# Patient Record
Sex: Female | Born: 1955 | Race: White | Hispanic: No | Marital: Single
Health system: Southern US, Community
[De-identification: ages and names within clinical notes are randomized; demographics above are authoritative.]

---

## 2005-02-03 ENCOUNTER — Ambulatory Visit: Payer: Self-pay | Admitting: Family Medicine

## 2005-02-23 ENCOUNTER — Encounter: Admission: RE | Admit: 2005-02-23 | Discharge: 2005-02-23 | Payer: Self-pay | Admitting: Sports Medicine

## 2005-03-01 ENCOUNTER — Encounter (INDEPENDENT_AMBULATORY_CARE_PROVIDER_SITE_OTHER): Payer: Self-pay | Admitting: *Deleted

## 2005-03-01 LAB — CONVERTED CEMR LAB

## 2005-03-02 ENCOUNTER — Encounter (INDEPENDENT_AMBULATORY_CARE_PROVIDER_SITE_OTHER): Payer: Self-pay | Admitting: *Deleted

## 2005-03-02 ENCOUNTER — Ambulatory Visit: Payer: Self-pay | Admitting: Family Medicine

## 2006-03-27 ENCOUNTER — Ambulatory Visit: Payer: Self-pay | Admitting: Sports Medicine

## 2006-11-24 ENCOUNTER — Encounter (INDEPENDENT_AMBULATORY_CARE_PROVIDER_SITE_OTHER): Payer: Self-pay | Admitting: *Deleted

## 2007-05-16 ENCOUNTER — Other Ambulatory Visit: Admission: RE | Admit: 2007-05-16 | Discharge: 2007-05-16 | Payer: Self-pay | Admitting: *Deleted

## 2011-07-06 ENCOUNTER — Other Ambulatory Visit: Payer: Self-pay | Admitting: Family Medicine

## 2011-07-06 DIAGNOSIS — Z1231 Encounter for screening mammogram for malignant neoplasm of breast: Secondary | ICD-10-CM

## 2011-07-19 ENCOUNTER — Ambulatory Visit
Admission: RE | Admit: 2011-07-19 | Discharge: 2011-07-19 | Disposition: A | Discharge status: Home / Self Care | Payer: 59 | Source: Ambulatory Visit | Attending: Family Medicine | Admitting: Family Medicine

## 2011-07-19 DIAGNOSIS — Z1231 Encounter for screening mammogram for malignant neoplasm of breast: Secondary | ICD-10-CM

## 2012-07-27 ENCOUNTER — Other Ambulatory Visit: Payer: Self-pay | Admitting: Family Medicine

## 2012-07-27 ENCOUNTER — Other Ambulatory Visit (HOSPITAL_COMMUNITY)
Admission: RE | Admit: 2012-07-27 | Discharge: 2012-07-27 | Disposition: A | Discharge status: Home / Self Care | Payer: 59 | Source: Ambulatory Visit | Attending: Family Medicine | Admitting: Family Medicine

## 2012-07-27 DIAGNOSIS — Z780 Asymptomatic menopausal state: Secondary | ICD-10-CM

## 2012-07-27 DIAGNOSIS — Z0000 Encounter for general adult medical examination without abnormal findings: Secondary | ICD-10-CM | POA: Insufficient documentation

## 2012-07-27 DIAGNOSIS — Z1231 Encounter for screening mammogram for malignant neoplasm of breast: Secondary | ICD-10-CM

## 2012-10-24 ENCOUNTER — Other Ambulatory Visit: Payer: Self-pay | Admitting: Family Medicine

## 2012-10-24 DIAGNOSIS — Z780 Asymptomatic menopausal state: Secondary | ICD-10-CM

## 2012-11-15 ENCOUNTER — Ambulatory Visit
Admission: RE | Admit: 2012-11-15 | Discharge: 2012-11-15 | Disposition: A | Discharge status: Home / Self Care | Payer: 59 | Source: Ambulatory Visit | Attending: Family Medicine | Admitting: Family Medicine

## 2012-11-15 DIAGNOSIS — Z780 Asymptomatic menopausal state: Secondary | ICD-10-CM

## 2012-12-06 ENCOUNTER — Other Ambulatory Visit: Payer: 59

## 2014-08-01 ENCOUNTER — Other Ambulatory Visit: Payer: Self-pay

## 2015-04-07 ENCOUNTER — Other Ambulatory Visit: Payer: Self-pay | Admitting: Family Medicine

## 2015-04-07 DIAGNOSIS — Z1231 Encounter for screening mammogram for malignant neoplasm of breast: Secondary | ICD-10-CM

## 2015-07-08 ENCOUNTER — Ambulatory Visit
Admission: RE | Admit: 2015-07-08 | Discharge: 2015-07-08 | Disposition: A | Discharge status: Home / Self Care | Payer: 59 | Source: Ambulatory Visit | Attending: Family Medicine | Admitting: Family Medicine

## 2015-07-08 DIAGNOSIS — Z1231 Encounter for screening mammogram for malignant neoplasm of breast: Secondary | ICD-10-CM

## 2015-10-16 MED FILL — SYNTHROID 88 MCG TABLET: 88 | 90 days supply | Qty: 90 | Fill #0

## 2015-11-04 DIAGNOSIS — L814 Other melanin hyperpigmentation: Secondary | ICD-10-CM | POA: Diagnosis not present

## 2015-11-04 DIAGNOSIS — D225 Melanocytic nevi of trunk: Secondary | ICD-10-CM | POA: Diagnosis not present

## 2015-11-04 DIAGNOSIS — D1801 Hemangioma of skin and subcutaneous tissue: Secondary | ICD-10-CM | POA: Diagnosis not present

## 2015-11-04 DIAGNOSIS — Z85828 Personal history of other malignant neoplasm of skin: Secondary | ICD-10-CM | POA: Diagnosis not present

## 2015-11-04 DIAGNOSIS — L821 Other seborrheic keratosis: Secondary | ICD-10-CM | POA: Diagnosis not present

## 2015-11-12 ENCOUNTER — Other Ambulatory Visit: Payer: Self-pay | Admitting: Family Medicine

## 2015-11-12 ENCOUNTER — Other Ambulatory Visit (HOSPITAL_COMMUNITY)
Admission: RE | Admit: 2015-11-12 | Discharge: 2015-11-12 | Disposition: A | Discharge status: Home / Self Care | Payer: 59 | Source: Ambulatory Visit | Attending: Family Medicine | Admitting: Family Medicine

## 2015-11-12 DIAGNOSIS — E785 Hyperlipidemia, unspecified: Secondary | ICD-10-CM | POA: Diagnosis not present

## 2015-11-12 DIAGNOSIS — E2839 Other primary ovarian failure: Secondary | ICD-10-CM

## 2015-11-12 DIAGNOSIS — Z0000 Encounter for general adult medical examination without abnormal findings: Secondary | ICD-10-CM | POA: Diagnosis not present

## 2015-11-12 DIAGNOSIS — Z1211 Encounter for screening for malignant neoplasm of colon: Secondary | ICD-10-CM | POA: Diagnosis not present

## 2015-11-12 DIAGNOSIS — M810 Age-related osteoporosis without current pathological fracture: Secondary | ICD-10-CM | POA: Diagnosis not present

## 2015-11-12 DIAGNOSIS — Z131 Encounter for screening for diabetes mellitus: Secondary | ICD-10-CM | POA: Diagnosis not present

## 2015-11-12 DIAGNOSIS — Z01419 Encounter for gynecological examination (general) (routine) without abnormal findings: Secondary | ICD-10-CM | POA: Insufficient documentation

## 2015-11-12 DIAGNOSIS — Z124 Encounter for screening for malignant neoplasm of cervix: Secondary | ICD-10-CM | POA: Diagnosis not present

## 2015-11-12 DIAGNOSIS — H6120 Impacted cerumen, unspecified ear: Secondary | ICD-10-CM | POA: Diagnosis not present

## 2015-11-13 LAB — CYTOLOGY - PAP

## 2015-12-01 ENCOUNTER — Ambulatory Visit
Admission: RE | Admit: 2015-12-01 | Discharge: 2015-12-01 | Disposition: A | Discharge status: Home / Self Care | Payer: 59 | Source: Ambulatory Visit | Attending: Family Medicine | Admitting: Family Medicine

## 2015-12-01 DIAGNOSIS — M810 Age-related osteoporosis without current pathological fracture: Secondary | ICD-10-CM | POA: Diagnosis not present

## 2015-12-01 DIAGNOSIS — E2839 Other primary ovarian failure: Secondary | ICD-10-CM

## 2016-01-22 MED FILL — SYNTHROID 88 MCG TABLET: 88 | 90 days supply | Qty: 90 | Fill #0

## 2016-04-26 MED FILL — SYNTHROID 88 MCG TABLET: 88 | 90 days supply | Qty: 90 | Fill #0

## 2016-05-12 DIAGNOSIS — E559 Vitamin D deficiency, unspecified: Secondary | ICD-10-CM | POA: Diagnosis not present

## 2016-05-12 DIAGNOSIS — M810 Age-related osteoporosis without current pathological fracture: Secondary | ICD-10-CM | POA: Diagnosis not present

## 2016-05-12 DIAGNOSIS — E039 Hypothyroidism, unspecified: Secondary | ICD-10-CM | POA: Diagnosis not present

## 2016-05-12 MED FILL — VIT D2 1.25 MG (50,000 UNIT: 1.25 MG | 84 days supply | Qty: 12 | Fill #0

## 2016-07-28 MED FILL — SYNTHROID 88 MCG TABLET: 88 | 90 days supply | Qty: 90 | Fill #0

## 2016-10-17 MED FILL — SYNTHROID 88 MCG TABLET: 88 | 90 days supply | Qty: 90 | Fill #1

## 2016-11-04 DIAGNOSIS — D1801 Hemangioma of skin and subcutaneous tissue: Secondary | ICD-10-CM | POA: Diagnosis not present

## 2016-11-04 DIAGNOSIS — L578 Other skin changes due to chronic exposure to nonionizing radiation: Secondary | ICD-10-CM | POA: Diagnosis not present

## 2016-11-04 DIAGNOSIS — Z85828 Personal history of other malignant neoplasm of skin: Secondary | ICD-10-CM | POA: Diagnosis not present

## 2016-11-04 DIAGNOSIS — D225 Melanocytic nevi of trunk: Secondary | ICD-10-CM | POA: Diagnosis not present

## 2016-11-04 DIAGNOSIS — L821 Other seborrheic keratosis: Secondary | ICD-10-CM | POA: Diagnosis not present

## 2016-11-04 DIAGNOSIS — L814 Other melanin hyperpigmentation: Secondary | ICD-10-CM | POA: Diagnosis not present

## 2016-11-04 DIAGNOSIS — L853 Xerosis cutis: Secondary | ICD-10-CM | POA: Diagnosis not present

## 2016-12-13 DIAGNOSIS — E039 Hypothyroidism, unspecified: Secondary | ICD-10-CM | POA: Diagnosis not present

## 2016-12-13 DIAGNOSIS — C801 Malignant (primary) neoplasm, unspecified: Secondary | ICD-10-CM | POA: Diagnosis not present

## 2016-12-13 DIAGNOSIS — M810 Age-related osteoporosis without current pathological fracture: Secondary | ICD-10-CM | POA: Diagnosis not present

## 2016-12-13 DIAGNOSIS — Z0000 Encounter for general adult medical examination without abnormal findings: Secondary | ICD-10-CM | POA: Diagnosis not present

## 2016-12-13 DIAGNOSIS — Z23 Encounter for immunization: Secondary | ICD-10-CM | POA: Diagnosis not present

## 2016-12-13 DIAGNOSIS — Z131 Encounter for screening for diabetes mellitus: Secondary | ICD-10-CM | POA: Diagnosis not present

## 2016-12-13 DIAGNOSIS — E559 Vitamin D deficiency, unspecified: Secondary | ICD-10-CM | POA: Diagnosis not present

## 2016-12-16 MED FILL — SYNTHROID 75 MCG TABLET: 75 | 90 days supply | Qty: 90 | Fill #0

## 2017-03-24 MED FILL — SYNTHROID 75 MCG TABLET: 75 | 90 days supply | Qty: 90 | Fill #0

## 2017-06-27 DIAGNOSIS — E039 Hypothyroidism, unspecified: Secondary | ICD-10-CM | POA: Diagnosis not present

## 2017-06-27 DIAGNOSIS — E063 Autoimmune thyroiditis: Secondary | ICD-10-CM | POA: Diagnosis not present

## 2017-06-27 DIAGNOSIS — E785 Hyperlipidemia, unspecified: Secondary | ICD-10-CM | POA: Diagnosis not present

## 2017-06-27 DIAGNOSIS — E559 Vitamin D deficiency, unspecified: Secondary | ICD-10-CM | POA: Diagnosis not present

## 2017-06-27 DIAGNOSIS — M810 Age-related osteoporosis without current pathological fracture: Secondary | ICD-10-CM | POA: Diagnosis not present

## 2017-06-29 MED FILL — SYNTHROID 75 MCG TABLET: 75 | 90 days supply | Qty: 90 | Fill #0

## 2017-09-20 MED FILL — SYNTHROID 75 MCG TABLET: 75 | 90 days supply | Qty: 90 | Fill #1

## 2017-11-09 DIAGNOSIS — L853 Xerosis cutis: Secondary | ICD-10-CM | POA: Diagnosis not present

## 2017-11-09 DIAGNOSIS — D1801 Hemangioma of skin and subcutaneous tissue: Secondary | ICD-10-CM | POA: Diagnosis not present

## 2017-11-09 DIAGNOSIS — L821 Other seborrheic keratosis: Secondary | ICD-10-CM | POA: Diagnosis not present

## 2017-11-09 DIAGNOSIS — D2272 Melanocytic nevi of left lower limb, including hip: Secondary | ICD-10-CM | POA: Diagnosis not present

## 2017-11-09 DIAGNOSIS — Z85828 Personal history of other malignant neoplasm of skin: Secondary | ICD-10-CM | POA: Diagnosis not present

## 2017-11-09 DIAGNOSIS — D225 Melanocytic nevi of trunk: Secondary | ICD-10-CM | POA: Diagnosis not present

## 2017-11-09 DIAGNOSIS — L570 Actinic keratosis: Secondary | ICD-10-CM | POA: Diagnosis not present

## 2017-11-09 DIAGNOSIS — L814 Other melanin hyperpigmentation: Secondary | ICD-10-CM | POA: Diagnosis not present

## 2017-11-09 DIAGNOSIS — D2271 Melanocytic nevi of right lower limb, including hip: Secondary | ICD-10-CM | POA: Diagnosis not present

## 2017-12-28 DIAGNOSIS — E039 Hypothyroidism, unspecified: Secondary | ICD-10-CM | POA: Diagnosis not present

## 2018-01-16 MED FILL — SYNTHROID 75 MCG TABLET: 75 | 90 days supply | Qty: 90 | Fill #2

## 2018-01-31 DIAGNOSIS — E039 Hypothyroidism, unspecified: Secondary | ICD-10-CM | POA: Diagnosis not present

## 2018-01-31 DIAGNOSIS — E785 Hyperlipidemia, unspecified: Secondary | ICD-10-CM | POA: Diagnosis not present

## 2018-01-31 DIAGNOSIS — Z0000 Encounter for general adult medical examination without abnormal findings: Secondary | ICD-10-CM | POA: Diagnosis not present

## 2018-01-31 DIAGNOSIS — Z1159 Encounter for screening for other viral diseases: Secondary | ICD-10-CM | POA: Diagnosis not present

## 2018-01-31 DIAGNOSIS — E063 Autoimmune thyroiditis: Secondary | ICD-10-CM | POA: Diagnosis not present

## 2018-01-31 DIAGNOSIS — M810 Age-related osteoporosis without current pathological fracture: Secondary | ICD-10-CM | POA: Diagnosis not present

## 2018-01-31 DIAGNOSIS — Z131 Encounter for screening for diabetes mellitus: Secondary | ICD-10-CM | POA: Diagnosis not present

## 2018-02-14 DIAGNOSIS — R195 Other fecal abnormalities: Secondary | ICD-10-CM | POA: Diagnosis not present

## 2018-03-14 DIAGNOSIS — M810 Age-related osteoporosis without current pathological fracture: Secondary | ICD-10-CM | POA: Diagnosis not present

## 2018-04-13 MED FILL — SYNTHROID 75 MCG TABLET: 75 | 90 days supply | Qty: 90 | Fill #3

## 2018-05-22 ENCOUNTER — Ambulatory Visit (INDEPENDENT_AMBULATORY_CARE_PROVIDER_SITE_OTHER): Payer: 59 | Admitting: Sports Medicine

## 2018-05-22 ENCOUNTER — Ambulatory Visit
Admission: RE | Admit: 2018-05-22 | Discharge: 2018-05-22 | Disposition: A | Discharge status: Home / Self Care | Payer: 59 | Source: Ambulatory Visit | Attending: Sports Medicine | Admitting: Sports Medicine

## 2018-05-22 ENCOUNTER — Encounter: Payer: Self-pay | Admitting: Sports Medicine

## 2018-05-22 VITALS — BP 126/82 | Ht 64.5 in | Wt 132.5 lb

## 2018-05-22 DIAGNOSIS — M25572 Pain in left ankle and joints of left foot: Secondary | ICD-10-CM | POA: Diagnosis not present

## 2018-05-22 DIAGNOSIS — G8929 Other chronic pain: Secondary | ICD-10-CM | POA: Diagnosis not present

## 2018-05-22 DIAGNOSIS — M7989 Other specified soft tissue disorders: Secondary | ICD-10-CM | POA: Diagnosis not present

## 2018-05-22 NOTE — Progress Notes (Signed)
HPI  CC: Left foot pain  Kristen Rios is a 62 year old female with history of osteoporosis who presents with left foot pain.  She states the pain started around 4 months ago.  She states she was doing a "shuffle dance", which she had never done before around that time.  She states that she does not member any specific injury during that dance, but states afterwards her ankle was swollen and bruised.  She states that since that time she is walked with a limp after prolonged walking over hard surfaces.  She states the area has remained somewhat swollen since that time.  She denies any previous trauma to the area.  She states it is made better with rest and elevation.  She states she has not tried any over-the-counter medications, or icing.  She denies any tingling or numbness in the foot.  She denies any weakness of the foot.  She states that the pain has been somewhat improving over the course the last several months, but is still present.  She denies any radiating pain up her leg.  No, she used a walking boot for 2 days around the time the initial injury.  Past Injuries: None Past Surgeries: None Smoking: Denies Family Hx: Noncontributory  All past medical history, medications, allergies reviewed by myself at today's visit.  ROS: Per HPI; in addition no fever, no rash, no additional weakness, no additional numbness, no additional paresthesias, and no additional falls/injury.   Objective: BP 126/82   Ht 5' 4.5" (1.638 m)   Wt 132 lb 8 oz (60.1 kg)   BMI 22.39 kg/m  Gen: NAD, well groomed, a/o x3, normal affect.  CV: Well-perfused. Warm.  Resp: Non-labored.  Neuro: Sensation intact throughout. No gross coordination deficits.  Gait: Nonpathologic posture, unremarkable stride without signs of limp or balance issues.  Left foot exam: No erythema or warmth.  Mild swelling noted over the medial left foot.  Tenderness palpation along the medial tubercle of the talus.  Full range of motion in  dorsiflexion, plantarflexion, eversion, inversion.  5 out of 5 strength throughout all testing.  Negative anterior drawer test.  ULTRASOUND: Ankle, left - Ankle joint: Medial and lateral ankle joint observed with preserved joint space, and no evidence of irregularity to the articular surface. No effusion. No evidence of fracture. No obvious osteophyte development. -Talus: Bony irregularity noted along the medial tubercle of the talus, with stress reaction of the cortex in long axis view.  There is an effusion noted around this bony abnormality.  Talar dome observed with preserved medial and lateral joint space and no evidence of irregularity to the articular surface.  - Medial malleolus observed without evidence of avulsion or bony irregularity.. - Ligaments: Anterior talofibular ligament intact. Anterior tibiofibular ligament intact.  Deltoid ligament intact.  IMPRESSION: findings consistent with possible stress fracture of the medial tubercle of the left talus.  Assessment and Plan:  Stress fracture of medial tubercle of left talus  We will advise [name] she likely has a stress reaction in the medial tubercle of her talus.  At this time we will put her in a walking boot (she can use to when she has at home).  We will obtain a three-view x-ray of her ankle to verify the images we saw on ultrasound today.  We will see her back in 3 weeks for follow-up.  At that time she has no improvement in her pain, or is worsening pain, we will obtain an MRI of the foot for  further evaluation.  [name], MD [facility] Fellow 05/22/2018 9:19 AM   Patient seen and evaluated with the fellow. I agree with the above plan of care. X-rays were reviewed. She does have some soft tissue swelling medially but I do not appreciate any bony abnormality. Ultrasound does suggest a possible healing stress fracture in the left talus. Cam Walker immobilization with crutches to assist with ambulation. Follow-up  in 3 weeks for reevaluation. If no improvement, consider further diagnostic imaging in the form of MRI. If patient notices significant improvement then consider compression sleeve and arch support.

## 2018-06-12 ENCOUNTER — Ambulatory Visit (INDEPENDENT_AMBULATORY_CARE_PROVIDER_SITE_OTHER): Payer: 59 | Admitting: Sports Medicine

## 2018-06-12 DIAGNOSIS — M84372D Stress fracture, left ankle, subsequent encounter for fracture with routine healing: Secondary | ICD-10-CM

## 2018-06-12 DIAGNOSIS — M84372A Stress fracture, left ankle, initial encounter for fracture: Secondary | ICD-10-CM | POA: Insufficient documentation

## 2018-06-12 NOTE — Patient Instructions (Signed)
It was nice meeting you today.  You were seen in clinic for follow-up of your left ankle stress fracture seems to be improving.  We fitted you for a compression sleeve today.  As we discussed, you can discontinue the boot and wear the compression sleeve with a shoe that has good arch and heel support.  If you have discomfort with this, you can wear the boot again.  We have tentatively scheduled a follow-up appointment for you in 3 weeks time if needed.   If you continue to have pain and are having to wear the boot again, we may consider obtaining an MRI at that time.  Please call clinic if you have any questions.  Lovenia Kim MD

## 2018-06-12 NOTE — Assessment & Plan Note (Addendum)
Improving. Appears to be healing well after 3 weeks of wearing boot.  Patient denies any pain or complaints today.  Normal ankle exam.  She has removed the boot this afternoon and is able to bear weight.  She was fitted with a full ankle helix compression sleeve today.  Advised her to wear this with a shoe that has good heel and arch support.  Tentative follow-up scheduled for 3 weeks if needed.  If she experiences discomfort with walking, she is to wear the boot again.  If pain continues, will consider MRI for further evaluation.

## 2018-06-12 NOTE — Progress Notes (Addendum)
   Subjective:   Patient ID: Kristen Rios    DOB: 01-Apr-1956, 62 y.o. female   MRN: [id number]  CC: f/u left ankle   HPI: Kristen Rios is a 62 y.o. female who presents to clinic today for the following issue.  Left ankle pain Patient following up for stress fracture of her left ankle which occurred earlier this summer.  She has been wearing a boot for the past 3 weeks which she took off at lunch today.  Overall feels improved, no pain or complaints today.  She is able to bear weight.  Has not been needing to take anti-inflammatories or apply ice.  No numbness or tingling.  She has not noted any swelling.  ROS: No swelling, erythema, numbness or tingling.  No joint pain.  [place]: Pertinent past medical, surgical, family, and social history were reviewed and updated as appropriate. Smoking status reviewed. Medications reviewed. Objective:   BP 104/74   Ht 5' 4.5" (1.638 m)   Wt 132 lb (59.9 kg)   BMI 22.31 kg/m  Vitals and nursing note reviewed.  General: 62 year old female, NAD  Left ankle: No visible erythema or swelling. Range of motion is full in all directions. Strength is 5/5 in all directions. Stable lateral and medial ligaments; squeeze test and kleiger test unremarkable;  No pain at base of 5th MT; No tenderness over cuboid; No tenderness over N spot or navicular prominence No tenderness on posterior aspects of lateral and medial malleolus.   Able to bear weight and walk with normal gait. Neurovascularly intact distally.  Skin: warm, dry, no rash Extremities: warm and well perfused, normal tone Neuro: alert, oriented x3, no focal deficits   Assessment & Plan:   Stress fracture of left ankle Improving. Appears to be healing well after 3 weeks of wearing boot.  Patient denies any pain or complaints today.  Normal ankle exam.  She has removed the boot this afternoon and is able to bear weight.  She was fitted with a full ankle helix compression sleeve today.   Advised her to wear this with a shoe that has good heel and arch support.  Tentative follow-up scheduled for 3 weeks if needed.  If she experiences discomfort with walking, she is to wear the boot again.  If pain continues, will consider MRI for further evaluation.   Lovenia Kim, MD [place], PGY-3 06/12/2018 3:06 PM  Patient seen and evaluated with the resident.  I agree with the above plan of care.  Patient is doing much better.  She will wean from her cam walker into a compression sleeve.  If she notices increasing pain then she will resume the cam walker for a few more days.  I have advised no recreational walking or running for the next 3 weeks.  We will set up a tentative follow-up for 3 weeks from now but if her symptoms have resolved then she may cancel that appointment, resume activity as tolerated, and follow-up with me as needed.

## 2018-07-03 ENCOUNTER — Ambulatory Visit: Payer: 59 | Admitting: Sports Medicine

## 2018-07-16 MED FILL — SYNTHROID 75 MCG TABLET: 75 | 90 days supply | Qty: 90 | Fill #0

## 2018-08-07 DIAGNOSIS — E785 Hyperlipidemia, unspecified: Secondary | ICD-10-CM | POA: Diagnosis not present

## 2018-08-07 DIAGNOSIS — E039 Hypothyroidism, unspecified: Secondary | ICD-10-CM | POA: Diagnosis not present

## 2018-10-01 MED FILL — SYNTHROID 75 MCG TABLET: 75 | 90 days supply | Qty: 90 | Fill #1

## 2019-01-08 MED FILL — SYNTHROID 75 MCG TABLET: 75 | 90 days supply | Qty: 90 | Fill #0 | Status: TO

## 2019-02-06 ENCOUNTER — Other Ambulatory Visit: Payer: Self-pay | Admitting: Family Medicine

## 2019-02-06 ENCOUNTER — Other Ambulatory Visit (HOSPITAL_COMMUNITY)
Admission: RE | Admit: 2019-02-06 | Discharge: 2019-02-06 | Disposition: A | Discharge status: Home / Self Care | Payer: 59 | Source: Ambulatory Visit | Attending: Family Medicine | Admitting: Family Medicine

## 2019-02-06 DIAGNOSIS — M810 Age-related osteoporosis without current pathological fracture: Secondary | ICD-10-CM | POA: Diagnosis not present

## 2019-02-06 DIAGNOSIS — Z1211 Encounter for screening for malignant neoplasm of colon: Secondary | ICD-10-CM | POA: Diagnosis not present

## 2019-02-06 DIAGNOSIS — E785 Hyperlipidemia, unspecified: Secondary | ICD-10-CM | POA: Diagnosis not present

## 2019-02-06 DIAGNOSIS — E063 Autoimmune thyroiditis: Secondary | ICD-10-CM | POA: Diagnosis not present

## 2019-02-06 DIAGNOSIS — Z01419 Encounter for gynecological examination (general) (routine) without abnormal findings: Secondary | ICD-10-CM | POA: Diagnosis not present

## 2019-02-06 DIAGNOSIS — E039 Hypothyroidism, unspecified: Secondary | ICD-10-CM | POA: Diagnosis not present

## 2019-02-06 DIAGNOSIS — Z0000 Encounter for general adult medical examination without abnormal findings: Secondary | ICD-10-CM | POA: Diagnosis not present

## 2019-02-06 DIAGNOSIS — Z131 Encounter for screening for diabetes mellitus: Secondary | ICD-10-CM | POA: Diagnosis not present

## 2019-02-06 DIAGNOSIS — Z124 Encounter for screening for malignant neoplasm of cervix: Secondary | ICD-10-CM | POA: Diagnosis not present

## 2019-02-08 LAB — CYTOLOGY - PAP: Diagnosis: NEGATIVE

## 2019-03-21 MED FILL — SYNTHROID 75 MCG TABLET: 75 | 90 days supply | Qty: 90 | Fill #0

## 2019-07-09 MED FILL — SYNTHROID 75 MCG TABLET: 75 | 90 days supply | Qty: 90 | Fill #0

## 2019-07-13 IMAGING — DX DG ANKLE COMPLETE 3+V*L*
3 series · 3 of 3 positions shown · non-contrast
Comparison: None.

CLINICAL DATA: 61-year-old with chronic pain of left ankle.
Persistent medial pain.

EXAM:
LEFT ANKLE COMPLETE - 3+ VIEW

[dg ankle complete left (1 of 3)]
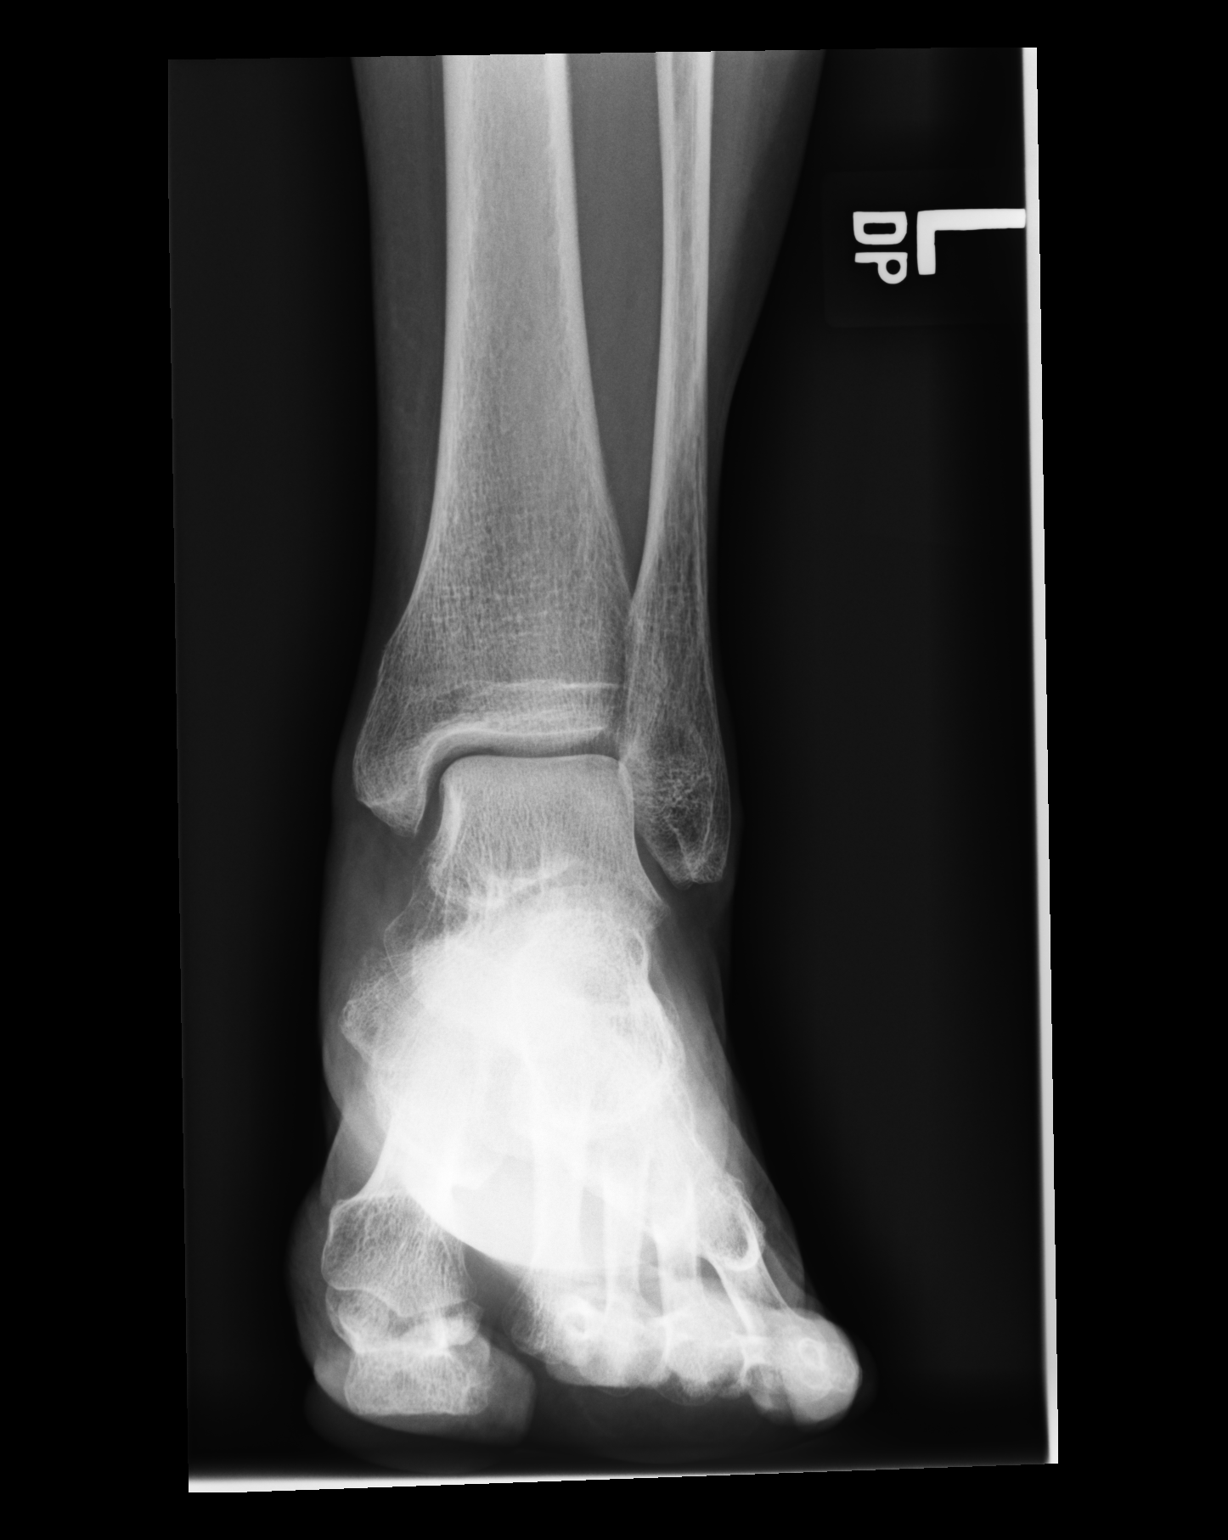

[dg ankle complete left (2 of 3)]
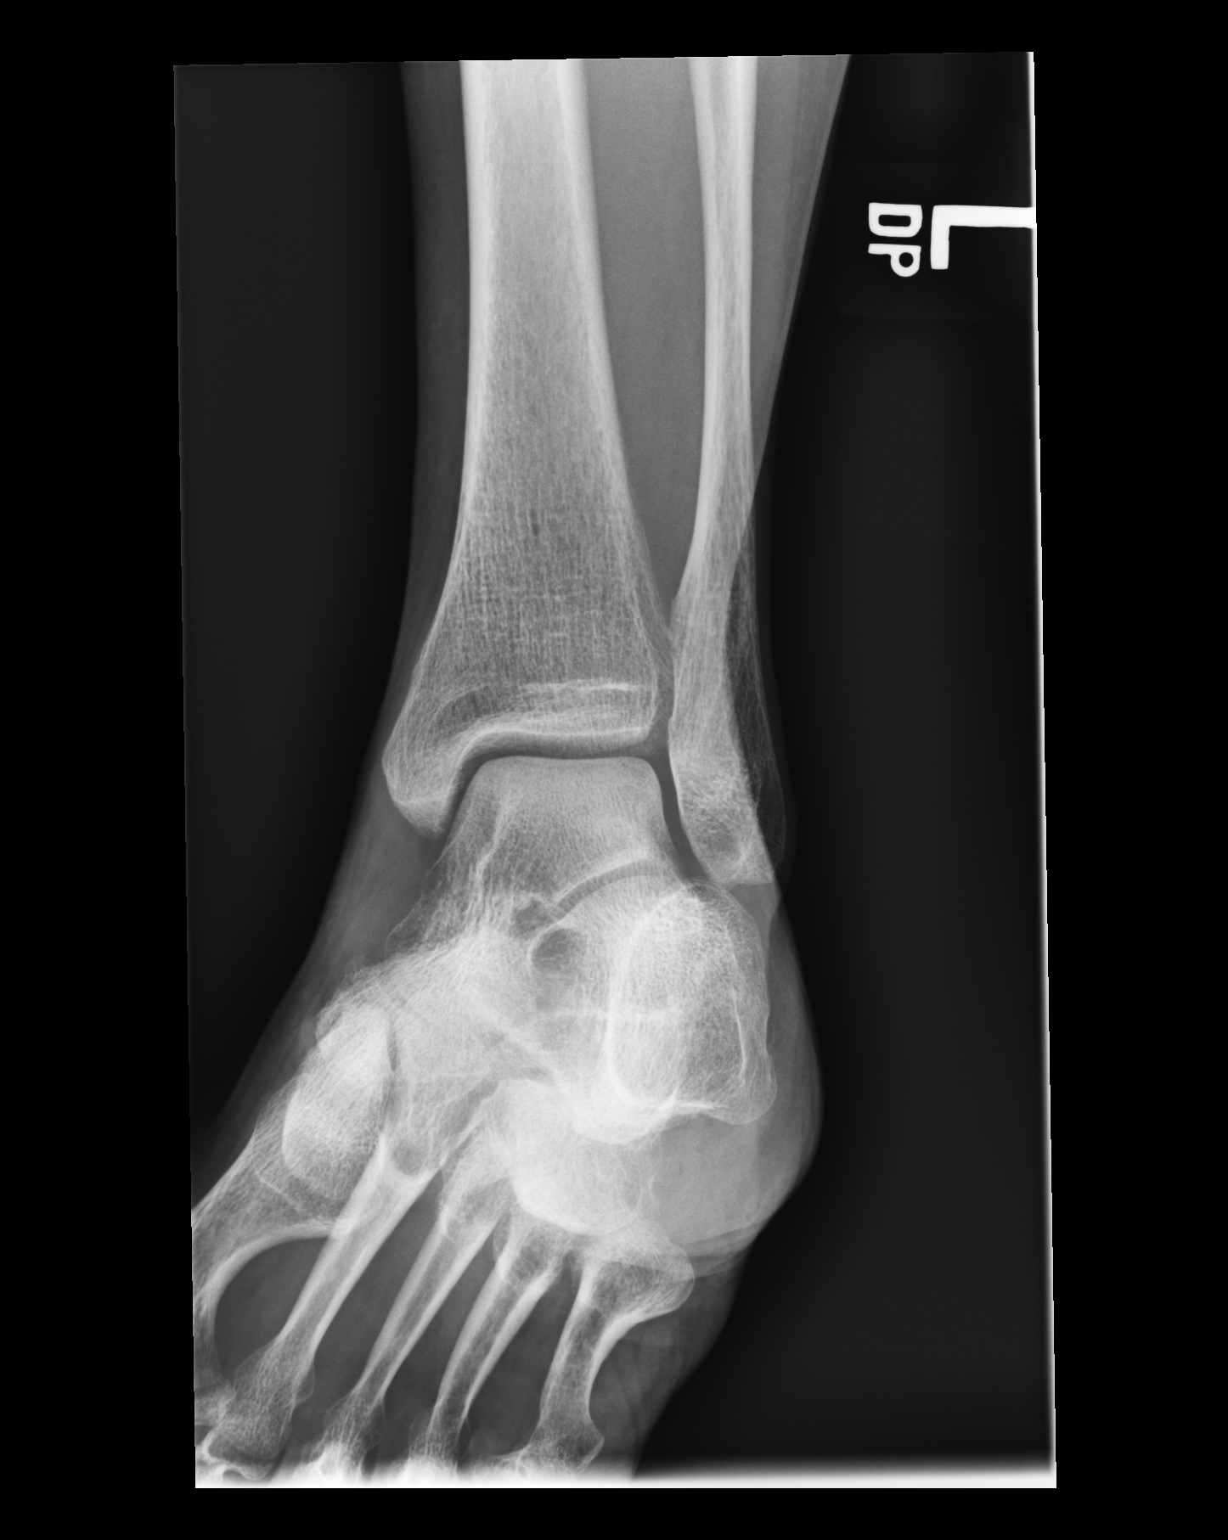

[dg ankle complete left (3 of 3)]
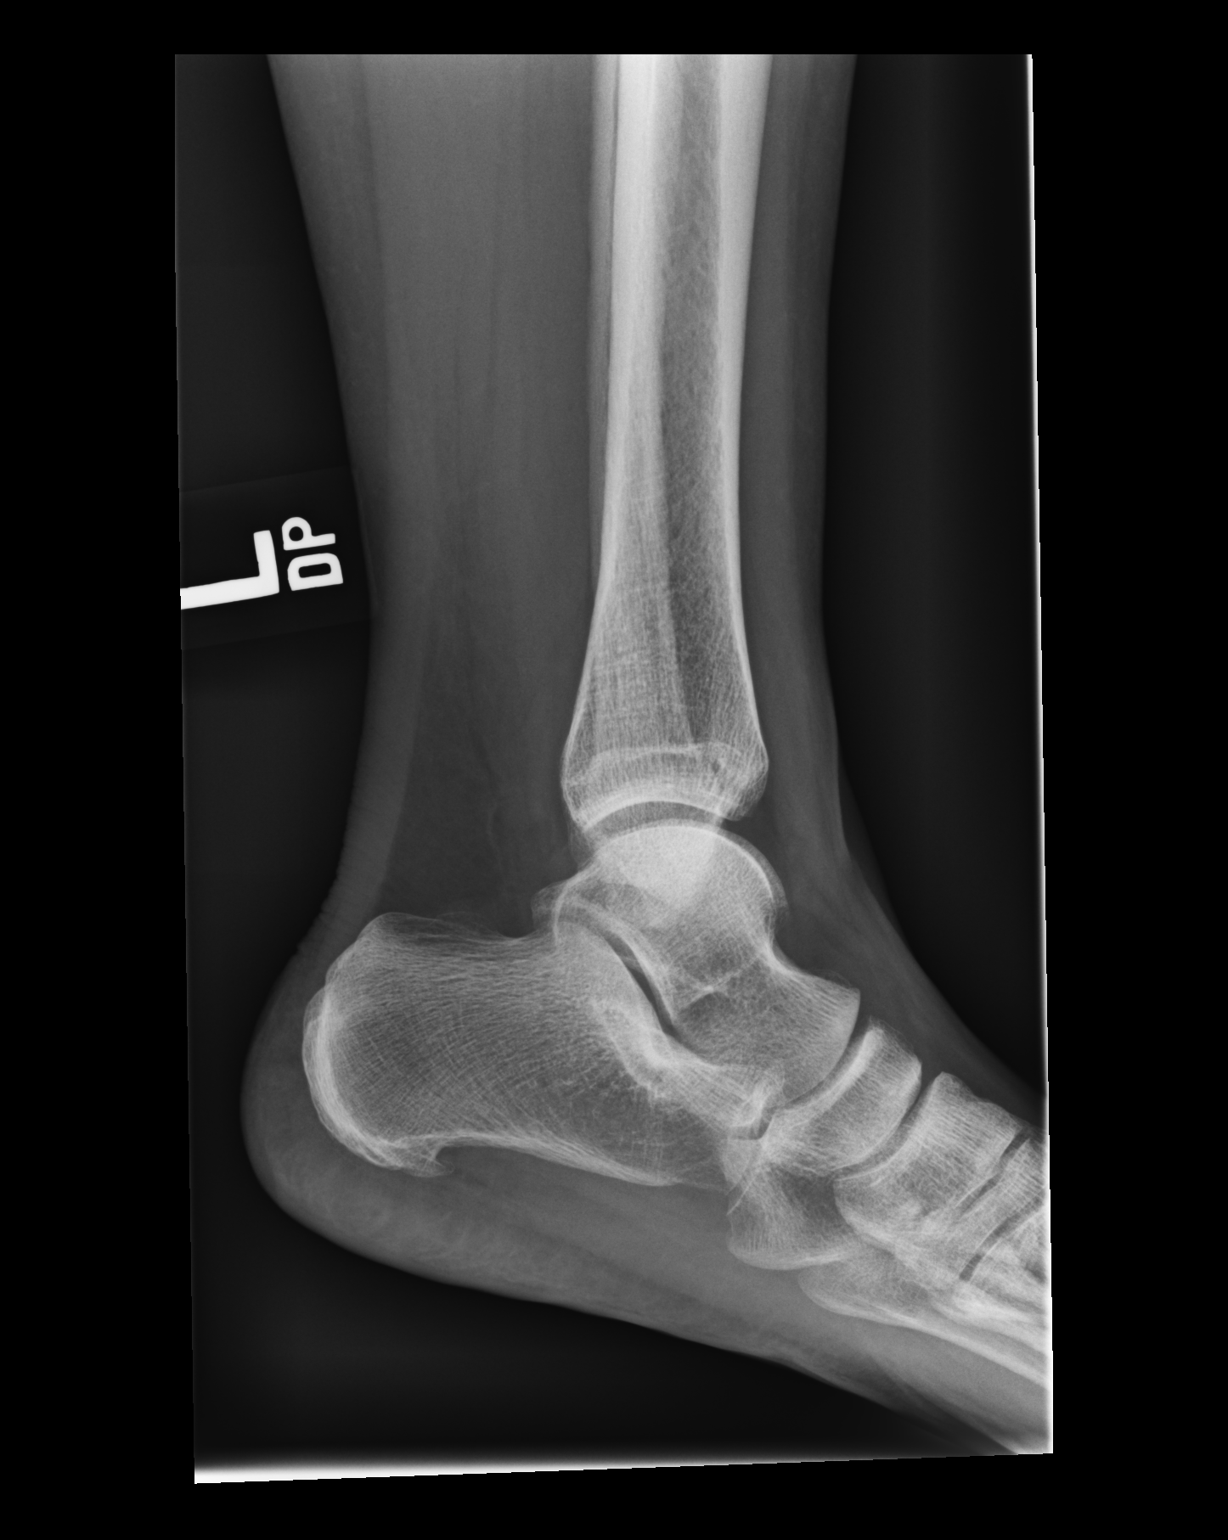

[3 of 3 positions shown; findings below may reference images not displayed]

FINDINGS: Soft tissue swelling along the medial ankle. Negative for a fracture
or dislocation. Alignment of the left ankle is normal. There is a
prominent plantar calcaneal spur.
IMPRESSION: Medial soft tissue swelling without acute bone abnormality.

## 2019-09-23 MED FILL — SYNTHROID 75 MCG TABLET: 75 | 90 days supply | Qty: 90 | Fill #1

## 2019-12-06 DIAGNOSIS — R030 Elevated blood-pressure reading, without diagnosis of hypertension: Secondary | ICD-10-CM | POA: Diagnosis not present

## 2019-12-06 DIAGNOSIS — H6123 Impacted cerumen, bilateral: Secondary | ICD-10-CM | POA: Diagnosis not present

## 2019-12-06 DIAGNOSIS — E785 Hyperlipidemia, unspecified: Secondary | ICD-10-CM | POA: Diagnosis not present

## 2019-12-06 DIAGNOSIS — E039 Hypothyroidism, unspecified: Secondary | ICD-10-CM | POA: Diagnosis not present

## 2019-12-06 DIAGNOSIS — E063 Autoimmune thyroiditis: Secondary | ICD-10-CM | POA: Diagnosis not present

## 2020-01-03 MED FILL — SYNTHROID 75 MCG TABLET: 75 | 90 days supply | Qty: 90 | Fill #2

## 2020-02-20 DIAGNOSIS — L821 Other seborrheic keratosis: Secondary | ICD-10-CM | POA: Diagnosis not present

## 2020-02-20 DIAGNOSIS — D1801 Hemangioma of skin and subcutaneous tissue: Secondary | ICD-10-CM | POA: Diagnosis not present

## 2020-02-20 DIAGNOSIS — D225 Melanocytic nevi of trunk: Secondary | ICD-10-CM | POA: Diagnosis not present

## 2020-02-20 DIAGNOSIS — L570 Actinic keratosis: Secondary | ICD-10-CM | POA: Diagnosis not present

## 2020-02-20 DIAGNOSIS — L814 Other melanin hyperpigmentation: Secondary | ICD-10-CM | POA: Diagnosis not present

## 2020-02-20 DIAGNOSIS — Z85828 Personal history of other malignant neoplasm of skin: Secondary | ICD-10-CM | POA: Diagnosis not present

## 2020-03-27 MED FILL — SYNTHROID 75 MCG TABLET: 75 | 90 days supply | Qty: 90 | Fill #3

## 2020-06-29 ENCOUNTER — Other Ambulatory Visit (HOSPITAL_COMMUNITY): Payer: Self-pay | Admitting: Family Medicine

## 2020-06-29 MED FILL — SYNTHROID 75 MCG TABLET: 75 | 90 days supply | Qty: 90 | Fill #0

## 2020-10-01 MED FILL — SYNTHROID 75 MCG TABLET: 75 | 90 days supply | Qty: 90 | Fill #1

## 2020-12-24 MED FILL — SYNTHROID 75 MCG TABLET: 75 | 90 days supply | Qty: 90 | Fill #2

## 2021-01-07 DIAGNOSIS — Z1211 Encounter for screening for malignant neoplasm of colon: Secondary | ICD-10-CM | POA: Diagnosis not present

## 2021-01-07 DIAGNOSIS — M810 Age-related osteoporosis without current pathological fracture: Secondary | ICD-10-CM | POA: Diagnosis not present

## 2021-01-07 DIAGNOSIS — Z0000 Encounter for general adult medical examination without abnormal findings: Secondary | ICD-10-CM | POA: Diagnosis not present

## 2021-01-07 DIAGNOSIS — E785 Hyperlipidemia, unspecified: Secondary | ICD-10-CM | POA: Diagnosis not present

## 2021-01-07 DIAGNOSIS — E039 Hypothyroidism, unspecified: Secondary | ICD-10-CM | POA: Diagnosis not present

## 2021-01-11 ENCOUNTER — Other Ambulatory Visit: Payer: Self-pay | Admitting: Family Medicine

## 2021-01-11 DIAGNOSIS — M810 Age-related osteoporosis without current pathological fracture: Secondary | ICD-10-CM

## 2021-02-08 DIAGNOSIS — Z1211 Encounter for screening for malignant neoplasm of colon: Secondary | ICD-10-CM | POA: Diagnosis not present

## 2021-03-30 ENCOUNTER — Other Ambulatory Visit (HOSPITAL_COMMUNITY): Payer: Self-pay

## 2021-03-30 MED FILL — Levothyroxine Sodium Tab 75 MCG: ORAL | 90 days supply | Qty: 90 | Fill #0 | Status: AC

## 2021-05-05 DIAGNOSIS — L821 Other seborrheic keratosis: Secondary | ICD-10-CM | POA: Diagnosis not present

## 2021-05-05 DIAGNOSIS — D1801 Hemangioma of skin and subcutaneous tissue: Secondary | ICD-10-CM | POA: Diagnosis not present

## 2021-05-05 DIAGNOSIS — L578 Other skin changes due to chronic exposure to nonionizing radiation: Secondary | ICD-10-CM | POA: Diagnosis not present

## 2021-05-05 DIAGNOSIS — Z85828 Personal history of other malignant neoplasm of skin: Secondary | ICD-10-CM | POA: Diagnosis not present

## 2021-05-05 DIAGNOSIS — L570 Actinic keratosis: Secondary | ICD-10-CM | POA: Diagnosis not present

## 2021-05-05 DIAGNOSIS — D2272 Melanocytic nevi of left lower limb, including hip: Secondary | ICD-10-CM | POA: Diagnosis not present

## 2021-05-05 DIAGNOSIS — L814 Other melanin hyperpigmentation: Secondary | ICD-10-CM | POA: Diagnosis not present

## 2021-05-06 DIAGNOSIS — H2513 Age-related nuclear cataract, bilateral: Secondary | ICD-10-CM | POA: Diagnosis not present

## 2021-06-29 ENCOUNTER — Other Ambulatory Visit (HOSPITAL_COMMUNITY): Payer: Self-pay

## 2021-06-30 ENCOUNTER — Other Ambulatory Visit (HOSPITAL_COMMUNITY): Payer: Self-pay

## 2021-06-30 MED ORDER — LEVOTHYROXINE SODIUM 75 MCG PO TABS
75.0000 ug | ORAL_TABLET | Freq: Every morning | ORAL | 3 refills | Status: AC
  Filled 2021-06-30: qty 90, 90d supply, fill #0
  Filled 2021-09-10: qty 90, 90d supply, fill #1

## 2021-07-08 ENCOUNTER — Other Ambulatory Visit (HOSPITAL_COMMUNITY): Payer: Self-pay

## 2021-07-12 DIAGNOSIS — E785 Hyperlipidemia, unspecified: Secondary | ICD-10-CM | POA: Diagnosis not present

## 2021-07-12 DIAGNOSIS — E039 Hypothyroidism, unspecified: Secondary | ICD-10-CM | POA: Diagnosis not present

## 2021-07-13 ENCOUNTER — Other Ambulatory Visit (HOSPITAL_COMMUNITY): Payer: Self-pay

## 2021-07-13 MED ORDER — ROSUVASTATIN CALCIUM 10 MG PO TABS
10.0000 mg | ORAL_TABLET | Freq: Every day | ORAL | 1 refills | Status: AC
  Filled 2021-07-13: qty 90, 90d supply, fill #0

## 2021-07-13 MED ORDER — LEVOTHYROXINE SODIUM 100 MCG PO TABS
100.0000 ug | ORAL_TABLET | Freq: Every day | ORAL | 1 refills | Status: AC
  Filled 2021-07-13: qty 90, 90d supply, fill #0

## 2021-07-14 ENCOUNTER — Other Ambulatory Visit (HOSPITAL_COMMUNITY): Payer: Self-pay

## 2021-07-26 ENCOUNTER — Ambulatory Visit
Admission: RE | Admit: 2021-07-26 | Discharge: 2021-07-26 | Disposition: A | Discharge status: Home / Self Care | Payer: PPO | Source: Ambulatory Visit | Attending: Family Medicine | Admitting: Family Medicine

## 2021-07-26 ENCOUNTER — Other Ambulatory Visit: Payer: Self-pay

## 2021-07-26 DIAGNOSIS — M810 Age-related osteoporosis without current pathological fracture: Secondary | ICD-10-CM | POA: Diagnosis not present

## 2021-07-26 DIAGNOSIS — M85852 Other specified disorders of bone density and structure, left thigh: Secondary | ICD-10-CM | POA: Diagnosis not present

## 2021-09-10 ENCOUNTER — Other Ambulatory Visit (HOSPITAL_COMMUNITY): Payer: Self-pay

## 2021-11-10 DIAGNOSIS — R5383 Other fatigue: Secondary | ICD-10-CM | POA: Diagnosis not present

## 2021-11-10 DIAGNOSIS — E559 Vitamin D deficiency, unspecified: Secondary | ICD-10-CM | POA: Diagnosis not present

## 2021-11-10 DIAGNOSIS — E039 Hypothyroidism, unspecified: Secondary | ICD-10-CM | POA: Diagnosis not present

## 2021-11-10 DIAGNOSIS — E063 Autoimmune thyroiditis: Secondary | ICD-10-CM | POA: Diagnosis not present

## 2021-11-10 DIAGNOSIS — E785 Hyperlipidemia, unspecified: Secondary | ICD-10-CM | POA: Diagnosis not present

## 2022-01-05 DIAGNOSIS — Z0100 Encounter for examination of eyes and vision without abnormal findings: Secondary | ICD-10-CM | POA: Diagnosis not present

## 2022-01-25 DIAGNOSIS — Z0100 Encounter for examination of eyes and vision without abnormal findings: Secondary | ICD-10-CM | POA: Diagnosis not present

## 2022-03-07 DIAGNOSIS — Z0000 Encounter for general adult medical examination without abnormal findings: Secondary | ICD-10-CM | POA: Diagnosis not present

## 2022-03-07 DIAGNOSIS — H6123 Impacted cerumen, bilateral: Secondary | ICD-10-CM | POA: Diagnosis not present

## 2022-03-07 DIAGNOSIS — M810 Age-related osteoporosis without current pathological fracture: Secondary | ICD-10-CM | POA: Diagnosis not present

## 2022-03-07 DIAGNOSIS — Z1211 Encounter for screening for malignant neoplasm of colon: Secondary | ICD-10-CM | POA: Diagnosis not present

## 2022-03-07 DIAGNOSIS — E785 Hyperlipidemia, unspecified: Secondary | ICD-10-CM | POA: Diagnosis not present

## 2022-03-07 DIAGNOSIS — E039 Hypothyroidism, unspecified: Secondary | ICD-10-CM | POA: Diagnosis not present

## 2022-03-31 DIAGNOSIS — Z1211 Encounter for screening for malignant neoplasm of colon: Secondary | ICD-10-CM | POA: Diagnosis not present

## 2022-06-22 DIAGNOSIS — D1801 Hemangioma of skin and subcutaneous tissue: Secondary | ICD-10-CM | POA: Diagnosis not present

## 2022-06-22 DIAGNOSIS — L814 Other melanin hyperpigmentation: Secondary | ICD-10-CM | POA: Diagnosis not present

## 2022-06-22 DIAGNOSIS — L821 Other seborrheic keratosis: Secondary | ICD-10-CM | POA: Diagnosis not present

## 2022-06-22 DIAGNOSIS — Z85828 Personal history of other malignant neoplasm of skin: Secondary | ICD-10-CM | POA: Diagnosis not present

## 2022-06-22 DIAGNOSIS — L570 Actinic keratosis: Secondary | ICD-10-CM | POA: Diagnosis not present

## 2022-06-22 DIAGNOSIS — D224 Melanocytic nevi of scalp and neck: Secondary | ICD-10-CM | POA: Diagnosis not present

## 2022-08-17 DIAGNOSIS — Z0100 Encounter for examination of eyes and vision without abnormal findings: Secondary | ICD-10-CM | POA: Diagnosis not present

## 2023-03-15 ENCOUNTER — Other Ambulatory Visit (HOSPITAL_COMMUNITY): Payer: Self-pay

## 2023-05-03 DIAGNOSIS — Z1211 Encounter for screening for malignant neoplasm of colon: Secondary | ICD-10-CM | POA: Diagnosis not present

## 2023-05-03 DIAGNOSIS — M810 Age-related osteoporosis without current pathological fracture: Secondary | ICD-10-CM | POA: Diagnosis not present

## 2023-05-03 DIAGNOSIS — H6121 Impacted cerumen, right ear: Secondary | ICD-10-CM | POA: Diagnosis not present

## 2023-05-03 DIAGNOSIS — Z0000 Encounter for general adult medical examination without abnormal findings: Secondary | ICD-10-CM | POA: Diagnosis not present

## 2023-05-03 DIAGNOSIS — E039 Hypothyroidism, unspecified: Secondary | ICD-10-CM | POA: Diagnosis not present

## 2023-05-03 DIAGNOSIS — E785 Hyperlipidemia, unspecified: Secondary | ICD-10-CM | POA: Diagnosis not present

## 2023-05-18 DIAGNOSIS — Z1211 Encounter for screening for malignant neoplasm of colon: Secondary | ICD-10-CM | POA: Diagnosis not present

## 2023-05-18 DIAGNOSIS — Z1212 Encounter for screening for malignant neoplasm of rectum: Secondary | ICD-10-CM | POA: Diagnosis not present

## 2023-06-30 DIAGNOSIS — R300 Dysuria: Secondary | ICD-10-CM | POA: Diagnosis not present

## 2023-08-09 DIAGNOSIS — L814 Other melanin hyperpigmentation: Secondary | ICD-10-CM | POA: Diagnosis not present

## 2023-08-09 DIAGNOSIS — D692 Other nonthrombocytopenic purpura: Secondary | ICD-10-CM | POA: Diagnosis not present

## 2023-08-09 DIAGNOSIS — I788 Other diseases of capillaries: Secondary | ICD-10-CM | POA: Diagnosis not present

## 2023-08-09 DIAGNOSIS — D1801 Hemangioma of skin and subcutaneous tissue: Secondary | ICD-10-CM | POA: Diagnosis not present

## 2023-08-09 DIAGNOSIS — L821 Other seborrheic keratosis: Secondary | ICD-10-CM | POA: Diagnosis not present

## 2023-08-09 DIAGNOSIS — L570 Actinic keratosis: Secondary | ICD-10-CM | POA: Diagnosis not present

## 2023-08-09 DIAGNOSIS — Z85828 Personal history of other malignant neoplasm of skin: Secondary | ICD-10-CM | POA: Diagnosis not present

## 2023-09-04 DIAGNOSIS — H2513 Age-related nuclear cataract, bilateral: Secondary | ICD-10-CM | POA: Diagnosis not present

## 2023-10-10 DIAGNOSIS — H2512 Age-related nuclear cataract, left eye: Secondary | ICD-10-CM | POA: Diagnosis not present

## 2023-10-10 DIAGNOSIS — H43812 Vitreous degeneration, left eye: Secondary | ICD-10-CM | POA: Diagnosis not present

## 2023-11-02 DIAGNOSIS — H2512 Age-related nuclear cataract, left eye: Secondary | ICD-10-CM | POA: Diagnosis not present

## 2023-12-01 DIAGNOSIS — Z0100 Encounter for examination of eyes and vision without abnormal findings: Secondary | ICD-10-CM | POA: Diagnosis not present

## 2024-05-14 DIAGNOSIS — Z0000 Encounter for general adult medical examination without abnormal findings: Secondary | ICD-10-CM | POA: Diagnosis not present

## 2024-05-14 DIAGNOSIS — Z23 Encounter for immunization: Secondary | ICD-10-CM | POA: Diagnosis not present

## 2024-05-14 DIAGNOSIS — N1831 Chronic kidney disease, stage 3a: Secondary | ICD-10-CM | POA: Diagnosis not present

## 2024-05-14 DIAGNOSIS — E785 Hyperlipidemia, unspecified: Secondary | ICD-10-CM | POA: Diagnosis not present

## 2024-05-14 DIAGNOSIS — E039 Hypothyroidism, unspecified: Secondary | ICD-10-CM | POA: Diagnosis not present

## 2024-05-14 DIAGNOSIS — M810 Age-related osteoporosis without current pathological fracture: Secondary | ICD-10-CM | POA: Diagnosis not present

## 2024-05-14 DIAGNOSIS — R7303 Prediabetes: Secondary | ICD-10-CM | POA: Diagnosis not present

## 2024-05-14 DIAGNOSIS — H6121 Impacted cerumen, right ear: Secondary | ICD-10-CM | POA: Diagnosis not present

## 2024-09-03 DIAGNOSIS — Z85828 Personal history of other malignant neoplasm of skin: Secondary | ICD-10-CM | POA: Diagnosis not present

## 2024-09-03 DIAGNOSIS — L570 Actinic keratosis: Secondary | ICD-10-CM | POA: Diagnosis not present

## 2024-09-03 DIAGNOSIS — L814 Other melanin hyperpigmentation: Secondary | ICD-10-CM | POA: Diagnosis not present

## 2024-09-03 DIAGNOSIS — L821 Other seborrheic keratosis: Secondary | ICD-10-CM | POA: Diagnosis not present

## 2024-09-03 DIAGNOSIS — D1801 Hemangioma of skin and subcutaneous tissue: Secondary | ICD-10-CM | POA: Diagnosis not present
